# Patient Record
Sex: Female | Born: 1960 | Race: White | Hispanic: No | Smoking: Current every day smoker
Health system: Southern US, Community
[De-identification: ages and names within clinical notes are randomized; demographics above are authoritative.]

## PROBLEM LIST (undated history)

## (undated) DIAGNOSIS — M1990 Unspecified osteoarthritis, unspecified site: Secondary | ICD-10-CM

## (undated) DIAGNOSIS — N390 Urinary tract infection, site not specified: Secondary | ICD-10-CM

## (undated) DIAGNOSIS — F329 Major depressive disorder, single episode, unspecified: Secondary | ICD-10-CM

## (undated) DIAGNOSIS — A09 Infectious gastroenteritis and colitis, unspecified: Secondary | ICD-10-CM

## (undated) DIAGNOSIS — F419 Anxiety disorder, unspecified: Secondary | ICD-10-CM

## (undated) DIAGNOSIS — I10 Essential (primary) hypertension: Secondary | ICD-10-CM

## (undated) DIAGNOSIS — F32A Depression, unspecified: Secondary | ICD-10-CM

## (undated) DIAGNOSIS — E7800 Pure hypercholesterolemia, unspecified: Secondary | ICD-10-CM

## (undated) DIAGNOSIS — C4490 Unspecified malignant neoplasm of skin, unspecified: Secondary | ICD-10-CM

## (undated) DIAGNOSIS — K8020 Calculus of gallbladder without cholecystitis without obstruction: Secondary | ICD-10-CM

## (undated) DIAGNOSIS — K219 Gastro-esophageal reflux disease without esophagitis: Secondary | ICD-10-CM

## (undated) DIAGNOSIS — J189 Pneumonia, unspecified organism: Secondary | ICD-10-CM

## (undated) HISTORY — DX: Unspecified malignant neoplasm of skin, unspecified: C44.90

## (undated) HISTORY — DX: Gastro-esophageal reflux disease without esophagitis: K21.9

## (undated) HISTORY — PX: OTHER SURGICAL HISTORY: SHX169

## (undated) HISTORY — DX: Calculus of gallbladder without cholecystitis without obstruction: K80.20

## (undated) HISTORY — PX: ABDOMINAL HYSTERECTOMY: SHX81

## (undated) HISTORY — DX: Infectious gastroenteritis and colitis, unspecified: A09

## (undated) HISTORY — DX: Pure hypercholesterolemia, unspecified: E78.00

## (undated) HISTORY — DX: Depression, unspecified: F32.A

## (undated) HISTORY — PX: COLONOSCOPY: SHX174

## (undated) HISTORY — DX: Major depressive disorder, single episode, unspecified: F32.9

## (undated) HISTORY — DX: Anxiety disorder, unspecified: F41.9

## (undated) HISTORY — DX: Urinary tract infection, site not specified: N39.0

## (undated) HISTORY — DX: Pneumonia, unspecified organism: J18.9

## (undated) HISTORY — DX: Unspecified osteoarthritis, unspecified site: M19.90

---

## 2007-07-06 ENCOUNTER — Ambulatory Visit: Payer: Self-pay | Admitting: Gastroenterology

## 2007-07-20 ENCOUNTER — Ambulatory Visit: Payer: Self-pay | Admitting: Gastroenterology

## 2007-07-20 ENCOUNTER — Encounter: Payer: Self-pay | Admitting: Gastroenterology

## 2007-10-31 DIAGNOSIS — E7800 Pure hypercholesterolemia, unspecified: Secondary | ICD-10-CM

## 2007-10-31 DIAGNOSIS — I499 Cardiac arrhythmia, unspecified: Secondary | ICD-10-CM | POA: Insufficient documentation

## 2007-10-31 DIAGNOSIS — C539 Malignant neoplasm of cervix uteri, unspecified: Secondary | ICD-10-CM | POA: Insufficient documentation

## 2007-10-31 DIAGNOSIS — M129 Arthropathy, unspecified: Secondary | ICD-10-CM | POA: Insufficient documentation

## 2007-10-31 DIAGNOSIS — K5730 Diverticulosis of large intestine without perforation or abscess without bleeding: Secondary | ICD-10-CM | POA: Insufficient documentation

## 2007-10-31 DIAGNOSIS — K648 Other hemorrhoids: Secondary | ICD-10-CM | POA: Insufficient documentation

## 2007-10-31 DIAGNOSIS — F341 Dysthymic disorder: Secondary | ICD-10-CM | POA: Insufficient documentation

## 2007-10-31 DIAGNOSIS — C4490 Unspecified malignant neoplasm of skin, unspecified: Secondary | ICD-10-CM

## 2007-10-31 DIAGNOSIS — T782XXA Anaphylactic shock, unspecified, initial encounter: Secondary | ICD-10-CM | POA: Insufficient documentation

## 2007-10-31 DIAGNOSIS — K209 Esophagitis, unspecified without bleeding: Secondary | ICD-10-CM | POA: Insufficient documentation

## 2007-10-31 DIAGNOSIS — K219 Gastro-esophageal reflux disease without esophagitis: Secondary | ICD-10-CM

## 2007-10-31 DIAGNOSIS — K449 Diaphragmatic hernia without obstruction or gangrene: Secondary | ICD-10-CM | POA: Insufficient documentation

## 2007-10-31 DIAGNOSIS — D126 Benign neoplasm of colon, unspecified: Secondary | ICD-10-CM

## 2007-10-31 DIAGNOSIS — M779 Enthesopathy, unspecified: Secondary | ICD-10-CM | POA: Insufficient documentation

## 2007-11-01 ENCOUNTER — Encounter: Payer: Self-pay | Admitting: Gastroenterology

## 2010-12-01 ENCOUNTER — Other Ambulatory Visit: Payer: Self-pay | Admitting: Dermatology

## 2011-01-20 NOTE — Assessment & Plan Note (Signed)
[facility]                         GASTROENTEROLOGY OFFICE NOTE   GICELA, SCHWARTING                        MRN:          [id number]  DATE:07/06/2007                            DOB:          1960-10-31    REFERRING PHYSICIAN:  Burgess Estelle, M.D.   REASON FOR CONSULTATION:  Black stools, abdominal pain and reflux  symptoms.   HISTORY OF PRESENT ILLNESS:  [name] is a 50 year old white female who  relates a long history of reflux problems.  She has occasionally taken  medications to control her symptoms.  Since June 2008 she has been  having recurrent episodes of anaphylaxis, associated with lip swelling  and generalized rashes.  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  She was placed on  ranitidine 150 mg b.i.d., primarily for her anaphylactic reactions and  her reflux symptoms and epigastric pain substantially improved.  Ranitidine was discontinued several weeks ago, and she noted worsening  problems with epigastric pain and 2 episodes of dark, black tarry  stools.  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  Her stools returned to a  normal brown color over the course of several days.  She has episodic  diarrhea, generally on days when she is not at work; and on days when  she is at work she tends not to move her bowels.  She relates lower  abdominal discomfort when she has diarrhea.  She notes no dysphagia,  odynophagia, change in stool caliber or bright red rectal bleeding.  She  has a paternal uncle who developed colon cancer in his 17s; no other  family members with colon cancer, colon polyps or inflammatory bowel  disease.  A CBC dated June 24, 2007 revealed a hemoglobin 14.3, with  a hematocrit of 42.6.  Her white blood cell count was minimally elevated  at 13.1, platelet count 328,000.  She has been using Advil  intermittently for tendinitis, but notes that Advil tends to cause  epigastric discomfort and diarrhea.  She has discontinued this and is  now only using Tylenol p.r.n.   PAST MEDICAL HISTORY:  1. Arthrtis.  2. Tendinitis.  3. Depression.  4. Anxiety.  5. Gastroesophageal reflux disease.  6. History of cervical cancer.  7. History of basal skin cell cancer.  8. Anaphylaxis - cause uncertain.  9. Status post hysterectomy.   CURRENT MEDICATIONS:  Listed on the chart, updated and reviewed.   MEDICATION ALLERGIES:  SULFA DRUGS (leading to rash).   SOCIAL HISTORY:  She is divorced with no children.  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  She  smokes approximately 1/2 pack of cigarettes a day, and denies other  tobacco product usage.  She uses alcohol occasionally.   REVIEW OF SYSTEMS:  Has multiple areas positive, as per the handwritten  form.   PHYSICAL EXAMINATION:  Overweight white female, in no acute  distress.  Height 4 feet 11-3/4 inches.  Weight 145.4 pounds.  Blood pressure  102/70, pulse 88 and regular.  HEENT:  Anicteric sclerae.  Oropharynx clear.  NECK:  Without thyromegaly or adenopathy appreciated.  CHEST:  Clear to auscultation bilaterally.  CARDIAC:  Regular rate and rhythm without murmur appreciated.  ABDOMEN:  Soft with mild to moderate epigastric tenderness with deep  palpation.  No rebound or guarding.  No palpable organomegaly, masses or  hernias.  Normal active bowel sounds.  RECTAL:  Digital rectal examination reveals no lesions and Hemocult-  negative brown stool in the vault.  EXTREMITIES:  Without clubbing, cyanosis or edema.  NEUROLOGIC:  Alert and oriented x3.  Grossly nonfocal.   ASSESSMENT AND PLAN:  Recent episode of melena.  Chronic reflux  symptoms.  Epigastric tenderness on physical examination.  Rule out  ulcer disease, gastritis,  duodenitis, esophagitis, Barrett's esophagus  and colorectal neoplasms.  Begin Prilosec over-the-counter 20 mg p.o.  q.a.m. or resume ranitidine 150 mg p.o. b.i.d.  Avoid all aspirin and  NSAID products.  Begin standard anti-reflux measures.  The risks,  benefits and alternatives to colonoscopy with possible biopsy and  possible polypectomy and upper endoscopy with possible biopsy were  discussed with the patient.  She consents to proceed and will be  scheduled electively.     Venita Lick. Russella Dar, MD, [facility]  Electronically Signed    MTS/MedQ  DD: 07/06/2007  DT: 07/06/2007  Job #: [id number]   cc:   Burgess Estelle, M.D.

## 2015-04-28 ENCOUNTER — Encounter (HOSPITAL_COMMUNITY): Payer: Self-pay | Admitting: Emergency Medicine

## 2015-04-28 ENCOUNTER — Emergency Department (HOSPITAL_COMMUNITY): Payer: BLUE CROSS/BLUE SHIELD

## 2015-04-28 ENCOUNTER — Emergency Department (HOSPITAL_COMMUNITY)
Admission: EM | Admit: 2015-04-28 | Discharge: 2015-04-28 | Disposition: A | Discharge status: Home / Self Care | Payer: BLUE CROSS/BLUE SHIELD | Attending: Emergency Medicine | Admitting: Emergency Medicine

## 2015-04-28 DIAGNOSIS — Z7902 Long term (current) use of antithrombotics/antiplatelets: Secondary | ICD-10-CM | POA: Diagnosis not present

## 2015-04-28 DIAGNOSIS — I10 Essential (primary) hypertension: Secondary | ICD-10-CM | POA: Diagnosis not present

## 2015-04-28 DIAGNOSIS — Z79899 Other long term (current) drug therapy: Secondary | ICD-10-CM | POA: Diagnosis not present

## 2015-04-28 DIAGNOSIS — Z791 Long term (current) use of non-steroidal anti-inflammatories (NSAID): Secondary | ICD-10-CM | POA: Diagnosis not present

## 2015-04-28 DIAGNOSIS — Z87440 Personal history of urinary (tract) infections: Secondary | ICD-10-CM | POA: Diagnosis not present

## 2015-04-28 DIAGNOSIS — J039 Acute tonsillitis, unspecified: Secondary | ICD-10-CM | POA: Insufficient documentation

## 2015-04-28 DIAGNOSIS — D72829 Elevated white blood cell count, unspecified: Secondary | ICD-10-CM | POA: Insufficient documentation

## 2015-04-28 DIAGNOSIS — H9209 Otalgia, unspecified ear: Secondary | ICD-10-CM | POA: Insufficient documentation

## 2015-04-28 DIAGNOSIS — J029 Acute pharyngitis, unspecified: Secondary | ICD-10-CM | POA: Diagnosis present

## 2015-04-28 HISTORY — DX: Essential (primary) hypertension: I10

## 2015-04-28 LAB — CBC WITH DIFFERENTIAL/PLATELET
BASOS PCT: 0 % (ref 0–1)
Basophils Absolute: 0 10*3/uL (ref 0.0–0.1)
Eosinophils Absolute: 0.1 10*3/uL (ref 0.0–0.7)
Eosinophils Relative: 0 % (ref 0–5)
HEMATOCRIT: 39.3 % (ref 36.0–46.0)
HEMOGLOBIN: 12.7 g/dL (ref 12.0–15.0)
Lymphocytes Relative: 15 % (ref 12–46)
Lymphs Abs: 3.5 10*3/uL (ref 0.7–4.0)
MCH: 28.8 pg (ref 26.0–34.0)
MCHC: 32.3 g/dL (ref 30.0–36.0)
MCV: 89.1 fL (ref 78.0–100.0)
MONOS PCT: 8 % (ref 3–12)
Monocytes Absolute: 1.8 10*3/uL — ABNORMAL HIGH (ref 0.1–1.0)
NEUTROS ABS: 17.9 10*3/uL — AB (ref 1.7–7.7)
NEUTROS PCT: 77 % (ref 43–77)
Platelets: 300 10*3/uL (ref 150–400)
RBC: 4.41 MIL/uL (ref 3.87–5.11)
RDW: 12.9 % (ref 11.5–15.5)
WBC: 23.3 10*3/uL — ABNORMAL HIGH (ref 4.0–10.5)

## 2015-04-28 LAB — URINE MICROSCOPIC-ADD ON

## 2015-04-28 LAB — LIPASE, BLOOD: Lipase: 20 U/L — ABNORMAL LOW (ref 22–51)

## 2015-04-28 LAB — URINALYSIS, ROUTINE W REFLEX MICROSCOPIC
BILIRUBIN URINE: NEGATIVE
GLUCOSE, UA: NEGATIVE mg/dL
Ketones, ur: NEGATIVE mg/dL
Nitrite: NEGATIVE
PH: 6 (ref 5.0–8.0)
Protein, ur: 30 mg/dL — AB
SPECIFIC GRAVITY, URINE: 1.022 (ref 1.005–1.030)
Urobilinogen, UA: 0.2 mg/dL (ref 0.0–1.0)

## 2015-04-28 LAB — I-STAT CG4 LACTIC ACID, ED: Lactic Acid, Venous: 1.23 mmol/L (ref 0.5–2.0)

## 2015-04-28 LAB — COMPREHENSIVE METABOLIC PANEL
ALBUMIN: 3.5 g/dL (ref 3.5–5.0)
ALK PHOS: 122 U/L (ref 38–126)
ALT: 11 U/L — AB (ref 14–54)
ANION GAP: 11 (ref 5–15)
AST: 17 U/L (ref 15–41)
BILIRUBIN TOTAL: 0.3 mg/dL (ref 0.3–1.2)
BUN: 9 mg/dL (ref 6–20)
CALCIUM: 9.6 mg/dL (ref 8.9–10.3)
CO2: 23 mmol/L (ref 22–32)
CREATININE: 0.64 mg/dL (ref 0.44–1.00)
Chloride: 102 mmol/L (ref 101–111)
GFR calc Af Amer: >60 mL/min (ref >60)
GFR calc non Af Amer: >60 mL/min (ref >60)
GLUCOSE: 137 mg/dL — AB (ref 65–99)
Potassium: 3.7 mmol/L (ref 3.5–5.1)
Sodium: 136 mmol/L (ref 135–145)
TOTAL PROTEIN: 7.7 g/dL (ref 6.5–8.1)

## 2015-04-28 MED ORDER — SODIUM CHLORIDE 0.9 % IV BOLUS (SEPSIS)
500.0000 mL | Freq: Once | INTRAVENOUS | Status: AC
  Administered 2015-04-28: 500 mL via INTRAVENOUS

## 2015-04-28 MED ORDER — HYDROCODONE-ACETAMINOPHEN 5-325 MG PO TABS
2.0000 | ORAL_TABLET | ORAL | Status: DC | PRN
Start: 2015-04-28 — End: 2017-09-09

## 2015-04-28 MED ORDER — IOHEXOL 300 MG/ML  SOLN
100.0000 mL | Freq: Once | INTRAMUSCULAR | Status: AC | PRN
  Administered 2015-04-28: 100 mL via INTRAVENOUS

## 2015-04-28 NOTE — ED Provider Notes (Signed)
CSN: [id number]     Arrival date & time 04/28/15  0147 History   First MD Initiated Contact with Patient 04/28/15 0202     Chief Complaint  Patient presents with  . Fever  . Sore Throat     (Consider location/radiation/quality/duration/timing/severity/associated sxs/prior Treatment) HPI Comments: Patient states that she has not felt well for the past week.  She's been to [facility].  She's had multiple tests including strep, mono, blood cultures all with normal results.  She was diagnosed with a urinary tract infection and was started on Keflex.  This was discontinued after several days when she was not feeling better and she was started on Omnicef, which is still taking.  Initially when she was seen she had an elevated white count of 19.2 yesterday, when she was seen again.  She had an elevated white count of 25.  Without a definitive cause.  She denies any nausea, vomiting, diarrhea, constipation, cough, recent travel, tick bites, although she does report she was bitten by a tick 8 years ago and developed alpha gall antibodies .  Denies any consumption of mammal meat since that time  Patient is a 54 y.o. female presenting with fever and pharyngitis. The history is provided by the patient.  Fever Temp source:  Unable to specify Severity:  Mild Onset quality:  Unable to specify Timing:  Unable to specify Chronicity:  New Ineffective treatments:  None tried Associated symptoms: congestion and ear pain   Associated symptoms: no chills, no cough, no headaches and no rhinorrhea   Sore Throat Associated symptoms include congestion and a fever. Pertinent negatives include no chills, coughing or headaches.    Past Medical History  Diagnosis Date  . Hypertension    Past Surgical History  Procedure Laterality Date  . Abdominal hysterectomy     No family history on file. Social History  Substance Use Topics  . Smoking status: Never Smoker   . Smokeless  tobacco: None  . Alcohol Use: No   OB History    No data available     Review of Systems  Constitutional: Positive for fever. Negative for chills.  HENT: Positive for congestion, ear discharge and ear pain. Negative for rhinorrhea.   Respiratory: Negative for cough.   Neurological: Negative for dizziness and headaches.  All other systems reviewed and are negative.     Allergies  Alpha-gal and Sulfa antibiotics  Home Medications   Prior to Admission medications   Medication Sig Start Date End Date Taking? Authorizing Provider  cilostazol (PLETAL) 100 MG tablet Take 100 mg by mouth daily.   Yes Historical Provider, MD  clopidogrel (PLAVIX) 75 MG tablet Take 75 mg by mouth daily.   Yes Historical Provider, MD  Cyanocobalamin (VITAMIN B 12 PO) Take 1 tablet by mouth daily.   Yes Historical Provider, MD  EPINEPHrine 0.3 mg/0.3 mL IJ SOAJ injection Inject 0.3 mg into the muscle once.   Yes Historical Provider, MD  KRILL OIL OMEGA-3 PO Take 1 capsule by mouth daily.   Yes Historical Provider, MD  loratadine (CLARITIN) 10 MG tablet Take 10 mg by mouth daily.   Yes Historical Provider, MD  meloxicam (MOBIC) 15 MG tablet Take 15 mg by mouth daily.   Yes Historical Provider, MD  nebivolol (BYSTOLIC) 10 MG tablet Take 10 mg by mouth daily.   Yes Historical Provider, MD  omeprazole (PRILOSEC) 20 MG capsule Take 20 mg by mouth daily.   Yes Historical Provider, MD  venlafaxine XR (EFFEXOR-XR) 150 MG 24 hr capsule Take 150 mg by mouth daily with breakfast.   Yes Historical Provider, MD  Vitamin D, Ergocalciferol, (DRISDOL) 50000 UNITS CAPS capsule Take 50,000 Units by mouth 2 (two) times a week.   Yes Historical Provider, MD  zolpidem (AMBIEN) 5 MG tablet Take 5 mg by mouth at bedtime.   Yes Historical Provider, MD   BP 106/75 mmHg  Pulse 98  Temp(Src) 98.2 F (36.8 C) (Oral)  Resp 13  SpO2 98% Physical Exam  Constitutional: She appears well-developed and well-nourished.  HENT:  Head:  Normocephalic.  Right Ear: External ear normal. Tympanic membrane is not bulging.  Left Ear: Tympanic membrane is not bulging.  Mouth/Throat: Uvula is midline. Oropharyngeal exudate and posterior oropharyngeal edema present.  Eyes: Pupils are equal, round, and reactive to light.  Neck: Normal range of motion.  Cardiovascular: Normal rate and regular rhythm.   Pulmonary/Chest: Effort normal and breath sounds normal.  Abdominal: Soft. Bowel sounds are normal.  Musculoskeletal: Normal range of motion. She exhibits no edema or tenderness.  Lymphadenopathy:    She has cervical adenopathy.  Neurological: She is alert.  Skin: Skin is warm. No erythema. There is pallor.  Vitals reviewed.   ED Course  Procedures (including critical care time) Labs Review Labs Reviewed  CBC WITH DIFFERENTIAL/PLATELET - Abnormal; Notable for the following:    WBC 23.3 (*)    Neutro Abs 17.9 (*)    Monocytes Absolute 1.8 (*)    All other components within normal limits  COMPREHENSIVE METABOLIC PANEL - Abnormal; Notable for the following:    Glucose, Bld 137 (*)    ALT 11 (*)    All other components within normal limits  LIPASE, BLOOD - Abnormal; Notable for the following:    Lipase 20 (*)    All other components within normal limits  URINALYSIS, ROUTINE W REFLEX MICROSCOPIC (NOT AT [facility]) - Abnormal; Notable for the following:    APPearance CLOUDY (*)    Hgb urine dipstick LARGE (*)    Protein, ur 30 (*)    Leukocytes, UA TRACE (*)    All other components within normal limits  URINE MICROSCOPIC-ADD ON - Abnormal; Notable for the following:    Squamous Epithelial / LPF FEW (*)    All other components within normal limits  I-STAT CG4 LACTIC ACID, ED  I-STAT CG4 LACTIC ACID, ED    Imaging Review Ct Soft Tissue Neck W Contrast  04/28/2015   CLINICAL DATA:  Fever and chills was sore throat for 1 week. Leukocytosis.  EXAM: CT NECK WITH CONTRAST  TECHNIQUE: Multidetector CT imaging of the neck was  performed using the standard protocol following the bolus administration of intravenous contrast.  CONTRAST:  176mL OMNIPAQUE IOHEXOL 300 MG/ML  SOLN  COMPARISON:  None.  FINDINGS: Pharynx and larynx: There is avid enhancement of the palatine and lingual tonsils with crypt low-density consistent with purulent material. No abscess or airway effacement. The epiglottis and larynx are unremarkable. No floor of mouth swelling. Enlarged bilateral cervical chain lymph nodes, greatest in the jugulodigastric stations, presumably reactive. There is no cavitation within the nodes. No retropharyngeal edema  Salivary glands: Negative  Thyroid: Sub cm nodule on the left is considered incidental based on size.  Vascular: No venous thrombosis. The cervical carotids are tortuous, especially the distal left ICA with widening which could reflect remote injury. No beading typical of fibromuscular dysplasia.  Limited intracranial: Negative  Visualized orbits: Negative  Mastoids and  visualized paranasal sinuses: Clear  Skeleton: Unremarkable  Upper chest: No pneumonia.  IMPRESSION: Tonsillitis with cervical adenopathy.  No abscess.   Electronically Signed   By: Monte Fantasia M.D.   On: 04/28/2015 04:09   I have personally reviewed and evaluated these images and lab results as part of my medical decision-making.   EKG Interpretation None     Patient does have an elevated white count of 23.3 but this is improved since last nights.  White count at [place].  Her CT scan shows that she has tonsil colitis, but no peritonsillar abscess.  Spoke with patient.  Recommend she continue taking her into biotic.  Her biggest complaint is the pain in her I will give her some pain medicine for the next several days or follow-up with her primary care physician [facility]DM   Final diagnoses:  None         Junius Creamer, NP 04/28/15 [place], MD 04/28/15 [phone]

## 2015-04-28 NOTE — Discharge Instructions (Signed)
A CT scan shows that you do not have an abscess in your throat.  You do have tonsillitis.  Please continue to take the Antivert by out of your prescribed several days ago.  U been given a referral to the ENT doctor if needed.  Please follow-up with your primary care physician

## 2015-04-28 NOTE — ED Notes (Signed)
Patient transported to CT 

## 2015-04-28 NOTE — ED Notes (Addendum)
Pt from home c/o fever, chills, sore throat x 1 week. She reports being treated with Omnicef for abcess of tonsils. She reports she is not feeling better. She reports she has been tested for strep,mono, and blood cultures were negative.

## 2015-04-28 NOTE — ED Notes (Signed)
Nurse starting IV 

## 2017-07-21 ENCOUNTER — Ambulatory Visit: Payer: BLUE CROSS/BLUE SHIELD

## 2017-07-21 ENCOUNTER — Encounter: Payer: Self-pay | Admitting: Podiatry

## 2017-07-21 ENCOUNTER — Ambulatory Visit: Payer: BLUE CROSS/BLUE SHIELD | Admitting: Podiatry

## 2017-07-21 DIAGNOSIS — M79671 Pain in right foot: Secondary | ICD-10-CM

## 2017-07-21 DIAGNOSIS — M6740 Ganglion, unspecified site: Secondary | ICD-10-CM | POA: Diagnosis not present

## 2017-07-21 NOTE — Progress Notes (Signed)
   Subjective:    Patient ID: Autumn Hull, female    DOB: 1961-07-12, 56 y.o.   MRN: [id number]  HPI    Review of Systems  All other systems reviewed and are negative.      Objective:   Physical Exam        Assessment & Plan:

## 2017-07-22 NOTE — Progress Notes (Signed)
Subjective:    Patient ID: Autumn Hull, female   DOB: 56 y.o.   MRN: [id number]   HPI patient presents with a cyst of the big toe joint of the right foot and states it's been inflamed and sore at times and that for the most part it doesn't hurt but she was concerned about its appearance. Patient does not smoke and likes to be active    Review of Systems  All other systems reviewed and are negative.       Objective:  Physical Exam  Constitutional: She appears well-developed and well-nourished.  Cardiovascular: Intact distal pulses.  Pulmonary/Chest: Effort normal.  Musculoskeletal: Normal range of motion.  Neurological: She is alert.  Skin: Skin is warm.  Nursing note and vitals reviewed.  neurovascular status intact muscle strength adequate range of motion within normal limits with patient found to have a cyst formation at the distal interphalangeal joint hallux right medial side that measures about 7 x 7 mm. 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 Gave instructions on soaks and reappoint  X-rays were negative for signs of bony calcification or narrowing of the joint surface

## 2017-08-11 ENCOUNTER — Encounter: Payer: Self-pay | Admitting: Gastroenterology

## 2017-08-30 ENCOUNTER — Telehealth: Payer: Self-pay | Admitting: General Practice

## 2017-08-30 ENCOUNTER — Telehealth: Payer: Self-pay | Admitting: Gastroenterology

## 2017-08-30 NOTE — Telephone Encounter (Signed)
Patient is scheduled to see [facility] on 09/09/17

## 2017-08-30 NOTE — Telephone Encounter (Signed)
Patient was admitted to [facility] for colitis.  She is scheduled for follow up on 09/09/17 3:15 with Ellouise Newer, PA She is asked to bring a copy of the records with her to the appt.  I explained that we do not have access to their records and will need them the day of the appt for [facility]. She verbalized understanding and will bring them with her

## 2017-08-30 NOTE — Telephone Encounter (Signed)
Pt called today to say that she had been referred to [place] and the referral had been denied because she is established with [facility]. [facility] had mailed her a letter on 08/11/2017 that she was due her colonoscopy. Patient said she hasn't been there in 10 years and she needed hospital follow up care for her colitis and [facility] couldn't see her until mid/late January. I asked where was she admitted and she said [facility]. I told her that we would not have anything until mid/late February and that's if the referral was accepted as a new patient. Patient is wanting to be seen this week and said it was an emergency. I told her we are closed tomorrow for Christmas and wouldn't be open Wednesday either. I told her if it was that bad to go to the ER and they have GI coverage. Patient is insisted on coming here to be seen. I put the original referral in [facility] office. Please advise. [phone]

## 2017-09-09 ENCOUNTER — Other Ambulatory Visit (INDEPENDENT_AMBULATORY_CARE_PROVIDER_SITE_OTHER): Payer: 59

## 2017-09-09 ENCOUNTER — Encounter: Payer: Self-pay | Admitting: Physician Assistant

## 2017-09-09 ENCOUNTER — Ambulatory Visit: Payer: 59 | Admitting: Physician Assistant

## 2017-09-09 VITALS — BP 110/68 | HR 72 | Ht 59.0 in | Wt 165.0 lb

## 2017-09-09 DIAGNOSIS — R112 Nausea with vomiting, unspecified: Secondary | ICD-10-CM

## 2017-09-09 DIAGNOSIS — R197 Diarrhea, unspecified: Secondary | ICD-10-CM | POA: Diagnosis not present

## 2017-09-09 DIAGNOSIS — R935 Abnormal findings on diagnostic imaging of other abdominal regions, including retroperitoneum: Secondary | ICD-10-CM

## 2017-09-09 DIAGNOSIS — R1084 Generalized abdominal pain: Secondary | ICD-10-CM

## 2017-09-09 LAB — CBC WITH DIFFERENTIAL/PLATELET
BASOS PCT: 0.4 % (ref 0.0–3.0)
Basophils Absolute: 0 10*3/uL (ref 0.0–0.1)
EOS PCT: 1.7 % (ref 0.0–5.0)
Eosinophils Absolute: 0.2 10*3/uL (ref 0.0–0.7)
HEMATOCRIT: 41.5 % (ref 36.0–46.0)
HEMOGLOBIN: 13.5 g/dL (ref 12.0–15.0)
LYMPHS PCT: 25.3 % (ref 12.0–46.0)
Lymphs Abs: 2.9 10*3/uL (ref 0.7–4.0)
MCHC: 32.6 g/dL (ref 30.0–36.0)
MCV: 91.3 fl (ref 78.0–100.0)
Monocytes Absolute: 1 10*3/uL (ref 0.1–1.0)
Monocytes Relative: 8.6 % (ref 3.0–12.0)
Neutro Abs: 7.3 10*3/uL (ref 1.4–7.7)
Neutrophils Relative %: 64 % (ref 43.0–77.0)
Platelets: 325 10*3/uL (ref 150.0–400.0)
RBC: 4.55 Mil/uL (ref 3.87–5.11)
RDW: 13.6 % (ref 11.5–15.5)
WBC: 11.4 10*3/uL — AB (ref 4.0–10.5)

## 2017-09-09 LAB — COMPREHENSIVE METABOLIC PANEL
ALT: 18 U/L (ref 0–35)
AST: 18 U/L (ref 0–37)
Albumin: 4 g/dL (ref 3.5–5.2)
Alkaline Phosphatase: 130 U/L — ABNORMAL HIGH (ref 39–117)
BUN: 10 mg/dL (ref 6–23)
CHLORIDE: 102 meq/L (ref 96–112)
CO2: 32 mEq/L (ref 19–32)
Calcium: 9.4 mg/dL (ref 8.4–10.5)
Creatinine, Ser: 0.61 mg/dL (ref 0.40–1.20)
GFR: 107.06 mL/min (ref >60.00)
Glucose, Bld: 92 mg/dL (ref 70–99)
POTASSIUM: 4.3 meq/L (ref 3.5–5.1)
SODIUM: 140 meq/L (ref 135–145)
Total Bilirubin: 0.4 mg/dL (ref 0.2–1.2)
Total Protein: 6.9 g/dL (ref 6.0–8.3)

## 2017-09-09 MED ORDER — HYOSCYAMINE SULFATE 0.125 MG SL SUBL: 0.1250 mg | SUBLINGUAL_TABLET | SUBLINGUAL | 1 refills | Status: DC | PRN

## 2017-09-09 NOTE — Progress Notes (Addendum)
Chief Complaint: "colitis"  HPI:    Autumn Hull is a 57 year old female with a past medical history as listed below, including anticoagulatiojn with Pletal and Plavix for PVD, who was referred to me by Earney Mallet, MD for a complaint of "colitis".      Patient has followed in our clinic previously in 2008 with Dr. Fuller Plan.  Patient's last colonoscopy was completed 07/20/2007 with a finding of colon polyps, diverticulosis and internal hemorrhoids.  It was recommended she have a repeat colonoscopy in 10 years.  Patient had an EGD 07/20/2007 due to epigastric abdominal pain and reflux of melena.  She had a finding of reflux esophagitis and a hiatal hernia.    Today, the patient brings with her records from a recent hospital visit to [facility].  According to an HPI patient was admitted 08/26/17 with ongoing diarrhea, nausea, vomiting and lightheadedness.  She described starting with watery diarrhea about 4-5 days prior to being seen which had been going at least 15 times a day for some time.  She was also having nausea and vomiting with her last episode that morning.  Labs showed an elevated white count of 13.8.  She had a CT of the abdomen and pelvis which showed a fluid-filled ascending colon and hepatic flexure which appeared mildly inflamed.  It was suspected she had an acute infectious colitis and there was suspected possible hyperenhancement of the small bowel indicating an acute enterocolitis.  There was no bowel obstruction or acute appendicitis suspected as well as no free fluid.  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  She had borderline-mild cardiomegaly and hepatic steatosis.  She was admitted and fluid resuscitated and treated with Zofran as well as empiric antibiotics including IV ciprofloxacin and IV Flagyl for possible infectious colitis for 3 days.  Patient was discharged on 08/26/17 and at that time her white count was 13.8 and she did continue with some mild abdominal  discomfort.    Today, the patient tells me that since discharge she has gradually started to have more formed stool.  She denies any liquid stools at this point and is only having 1 or 2 bowel movements a day.  She does continue with abdominal pain and describes this as a "cramping", this is somewhat worse after eating anything even though "I am not eating much at all".  She has been trying to abide by a [employer].  Patient tells me she has had some continued episodes of nausea and in fact vomited her coffee this morning but tells me this was "because I drank it on an empty stomach".  She denies any continued feeling of dizziness.  She does admit to continually feeling weak.  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  She denies any continued fever or chills.  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    Patient's last colonoscopy was as above, she is due for repeat.    Patient denies fever, chills, weight loss, anorexia, hematochezia, melena or symptoms that awaken her at night.  Past Medical History:  Diagnosis Date  . Hypertension     Past Surgical History:  Procedure Laterality Date  . ABDOMINAL HYSTERECTOMY      Current Outpatient Medications  Medication Sig Dispense Refill  . cilostazol (PLETAL) 100 MG tablet  Take 100 mg by mouth daily.    . clopidogrel (PLAVIX) 75 MG tablet Take 75 mg by mouth daily.    Marland Kitchen EPINEPHrine 0.3 mg/0.3 mL IJ SOAJ injection Inject 0.3 mg into the muscle once.    Marland Kitchen KRILL OIL OMEGA-3 PO Take 1 capsule by mouth daily.    Marland Kitchen loratadine (CLARITIN) 10 MG tablet Take 10 mg by mouth daily.    . meloxicam (MOBIC) 15 MG tablet Take 15 mg by mouth daily.    . mirtazapine (REMERON) 7.5 MG tablet Take 7.5 mg by mouth at bedtime.    . nebivolol (BYSTOLIC) 10 MG tablet Take 10 mg by mouth daily.    Marland Kitchen  omeprazole (PRILOSEC) 20 MG capsule Take 20 mg by mouth daily.    Marland Kitchen venlafaxine XR (EFFEXOR-XR) 150 MG 24 hr capsule Take 150 mg by mouth daily with breakfast.    . Vitamin D, Ergocalciferol, (DRISDOL) 50000 UNITS CAPS capsule Take 50,000 Units by mouth 2 (two) times a week.    . zolpidem (AMBIEN) 5 MG tablet Take 5 mg by mouth at bedtime.     No current facility-administered medications for this visit.     Allergies as of 09/09/2017 - Review Complete 09/09/2017  Allergen Reaction Noted  . 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 Sulfa antibiotics  04/28/2015    No family history on file.  Social History   Socioeconomic History  . Marital status: Divorced    Spouse name: Not on file  . Number of children: Not on file  . Years of education: Not on file  . Highest education level: Not on file  Social Needs  . Financial resource strain: Not on file  . Food insecurity - worry: Not on file  . Food insecurity - inability: Not on file  . Transportation needs - medical: Not on file  . Transportation needs - non-medical: Not on file  Occupational History  . Not on file  Tobacco Use  . Smoking status: Never Smoker  Substance and Sexual Activity  . Alcohol use: No  . Drug use: Not on file  . Sexual activity: Not on file  Other Topics Concern  . Not on file  Social History Narrative  . Not on file    Review of Systems:    Constitutional: No weight loss, fever or chills Skin: No rash  Cardiovascular: No chest pain Respiratory: No SOB  Gastrointestinal: See HPI and otherwise negative Genitourinary: No dysuria Neurological: No headache, dizziness or syncope Musculoskeletal: No new muscle or joint pain Hematologic: No bleeding  Psychiatric: No history of depression or anxiety   Physical Exam:  Vital signs: Ht 4\' 11"  (1.499 m) Comment: height measured without shoes.  Wt 165 lb (74.8 kg)   BMI 33.33 kg/m   Constitutional:   Pleasant overweight Caucasian female appears to be in  NAD, Well developed, Well nourished, alert and cooperative Head:  Normocephalic and atraumatic. Eyes:   PEERL, EOMI. No icterus. Conjunctiva pink. Ears:  Normal auditory acuity. Neck:  Supple Throat: Oral cavity and pharynx without inflammation, swelling or lesion.  Respiratory: Respirations even and unlabored. Lungs clear to auscultation bilaterally.   No wheezes, crackles, or rhonchi.  Cardiovascular: Normal S1, S2. No MRG. Regular rate and rhythm. No peripheral edema, cyanosis or pallor.  Gastrointestinal:  Soft, nondistended, moderate generalized ttp, No rebound or guarding. Normal bowel sounds. No appreciable masses or hepatomegaly. Rectal:  Not performed.  Msk:  Symmetrical without gross deformities. Without edema, no deformity or joint abnormality.  Neurologic:  Alert and  oriented x4;  grossly normal neurologically.  Skin:   Dry and intact without significant lesions or rashes. Psychiatric: Demonstrates good judgement and reason without abnormal affect or behaviors.  See HPI for recent labs and imaging.  Assessment: 1.  Abnormal CT of the abdomen and pelvis: As in HPI showing colitis, suspected infectious etiology, patient had 3 days of Cipro/ Flagyl IV in the hospital, since discharge has gotten some better but does continue with abdominal pain, occasional nausea and vomiting; concern for ongoing colitis 2.  Abdominal pain: Patient continues with a cramping pain, consider continued colitis versus other 3.  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  Diarrhea: Better after hospital stay and 3 days of Cipro/ Flagyl, suspected to be infectious, though no stool studies were done, CT with colitis as above and in HPI  Plan: 1.  Ordered a repeat CT of the abdomen and pelvis.  Patient requested this be done in  [place]. 2.  Patient will need a colonoscopy in the future, pending results of above.  If it appears patient is still inflamed, could consider 7 days of Cipro/ Flagyl and then waiting 4-6 weeks prior to colonoscopy.  If CT is normal can go ahead and schedule at next available. 3.  Discussed with the patient that she will need to hold her Pletal for 7 days prior to time of her procedure and her Plavix for 5 days prior to time of her procedure.  We would have to communicate with her cardiologist Dr. Sabra Heck to ensure that holding these is acceptable for her. 4.  Ordered repeat CBC and CMP. 5.  Recommend the patient stay on a low fiber/low residue diet.  Did provide her with a handout for now.  She should continue this for the next 1-2 weeks. 6.  Provided the patient with a prescription for Hyoscyamine sulfate 0.125 mg ODT, every 4-6 hours as needed for abdominal cramping #30 with 1 refill 7.  Patient to follow in clinic per recommendations after CT and labs reviewed.  When she has colonoscopy, this will need to be scheduled with Dr. Fuller Plan in [facility].  Ellouise Newer, PA-C [facility] 09/09/2017, 3:23 PM  Cc: Earney Mallet, MD   09/23/17-1234 Reviewed patient's CT abdomen and pelvis with contrast completed 09/17/17.  This shows no acute findings in the abdomen or pelvis. jll

## 2017-09-09 NOTE — Patient Instructions (Signed)
We have given you a Low fiber/low residue   We have sent the following medications to your pharmacy for you to pick up at your convenience: Hyoscyamine 0.125 mg every 4-6 hours as needed for abd cramping  You have been scheduled for a CT scan of the abdomen and pelvis at [facility] [address] [place], [place] [address].   You are scheduled on 09/15/17 at 10:45 am. You should arrive 15 minutes prior to your appointment time for registration. Please follow the written instructions below on the day of your exam:  WARNING: IF YOU ARE ALLERGIC TO IODINE/X-RAY DYE, PLEASE NOTIFY RADIOLOGY IMMEDIATELY AT [phone]! YOU WILL BE GIVEN A 13 HOUR PREMEDICATION PREP.  1) Do not eat anything after 6:45 am (4 hours prior to your test) 2) You have been given 2 bottles of oral contrast to drink. The solution may taste               better if refrigerated, but do NOT add ice or any other liquid to this solution. Shake             well before drinking.    Drink 1 bottle of contrast @ 8:45 am (2 hours prior to your exam)  Drink 1 bottle of contrast @ 9:45 am (1 hour prior to your exam)  You may take any medications as prescribed with a small amount of water except for the following: Metformin, Glucophage, Glucovance, Avandamet, Riomet, Fortamet, Actoplus Met, Janumet, Glumetza or Metaglip. The above medications must be held the day of the exam AND 48 hours after the exam.  The purpose of you drinking the oral contrast is to aid in the visualization of your intestinal tract. The contrast solution may cause some diarrhea. Before your exam is started, you will be given a small amount of fluid to drink. Depending on your individual set of symptoms, you may also receive an intravenous injection of x-ray contrast/dye. Plan on being at [facility] for 30 minutes or longer, depending on the type of exam you are having performed.  This test typically takes 30-45 minutes to complete.  If you have any  questions regarding your exam or if you need to reschedule, you may call the CT department at [phone] between the hours of 8:00 am and 5:00 pm, Monday-Friday.  ________________________________________________________________________

## 2017-09-09 NOTE — Progress Notes (Signed)
Reviewed and agree with initial management plan.  Johannah Rozas T. Dystany Duffy, MD FACG 

## 2017-09-10 ENCOUNTER — Telehealth: Payer: Self-pay | Admitting: Physician Assistant

## 2017-09-10 NOTE — Telephone Encounter (Signed)
Patient returning call from [place] about lab results from yesterday 1.3.19. Pt requesting a call on work number.

## 2017-09-10 NOTE — Telephone Encounter (Signed)
See result note.  

## 2017-09-16 ENCOUNTER — Telehealth: Payer: Self-pay | Admitting: Physician Assistant

## 2017-09-16 NOTE — Telephone Encounter (Signed)
The pt has been re instructed for her CT scan and all questions answered.  She will call back with any further concerns

## 2017-09-17 ENCOUNTER — Encounter: Payer: Self-pay | Admitting: Gastroenterology

## 2017-09-23 ENCOUNTER — Telehealth: Payer: Self-pay | Admitting: Physician Assistant

## 2017-09-23 NOTE — Telephone Encounter (Signed)
I spoke to the imaging center in [place] and they will fax the report to our office.

## 2017-09-23 NOTE — Telephone Encounter (Signed)
Levin Erp, [place]  Jeoffrey Massed, RN        Reviewed CT- Now normal. Thanks-JLL    The pt has been advised and was scheduled to see Dr Fuller Plan to discuss colon.

## 2017-09-23 NOTE — Telephone Encounter (Signed)
Report was received and put on Jennifer's desk for review.

## 2017-10-26 ENCOUNTER — Ambulatory Visit: Payer: 59 | Admitting: Gastroenterology

## 2017-11-10 ENCOUNTER — Ambulatory Visit: Payer: 59 | Admitting: Gastroenterology

## 2017-11-10 ENCOUNTER — Encounter: Payer: Self-pay | Admitting: Gastroenterology

## 2017-11-10 ENCOUNTER — Telehealth: Payer: Self-pay

## 2017-11-10 VITALS — BP 108/62 | HR 70 | Ht 59.75 in | Wt 164.0 lb

## 2017-11-10 DIAGNOSIS — Z7902 Long term (current) use of antithrombotics/antiplatelets: Secondary | ICD-10-CM

## 2017-11-10 DIAGNOSIS — R933 Abnormal findings on diagnostic imaging of other parts of digestive tract: Secondary | ICD-10-CM | POA: Diagnosis not present

## 2017-11-10 DIAGNOSIS — K219 Gastro-esophageal reflux disease without esophagitis: Secondary | ICD-10-CM | POA: Diagnosis not present

## 2017-11-10 DIAGNOSIS — R197 Diarrhea, unspecified: Secondary | ICD-10-CM

## 2017-11-10 DIAGNOSIS — Z1211 Encounter for screening for malignant neoplasm of colon: Secondary | ICD-10-CM | POA: Diagnosis not present

## 2017-11-10 DIAGNOSIS — Z1212 Encounter for screening for malignant neoplasm of rectum: Secondary | ICD-10-CM

## 2017-11-10 MED ORDER — NA SULFATE-K SULFATE-MG SULF 17.5-3.13-1.6 GM/177ML PO SOLN: 1.0000 | ORAL | 0 refills | Status: DC

## 2017-11-10 NOTE — Patient Instructions (Signed)
You have been scheduled for an endoscopy and colonoscopy. Please follow the written instructions given to you at your visit today. Please pick up your prep supplies at the pharmacy within the next 1-3 days. If you use inhalers (even only as needed), please bring them with you on the day of your procedure. Your physician has requested that you go to www.startemmi.com and enter the access code given to you at your visit today. This web site gives a general overview about your procedure. However, you should still follow specific instructions given to you by our office regarding your preparation for the procedure.   If you are age 45 or older, your body mass index should be between 23-30. Your Body mass index is 32.3 kg/m. If this is out of the aforementioned range listed, please consider follow up with your Primary Care Provider.  If you are age 72 or younger, your body mass index should be between 19-25. Your Body mass index is 32.3 kg/m. If this is out of the aformentioned range listed, please consider follow up with your Primary Care Provider.

## 2017-11-10 NOTE — Telephone Encounter (Signed)
Faxed letter to get approval to [facility], Dr. Donnetta Simpers office [phone]. The phone number is [phone].

## 2017-11-10 NOTE — Progress Notes (Signed)
    History of Present Illness: This is a 57 year old female with a resolved infectious colitis. Has had mild chronic intermittent diarrhea for few years that has not changed.  Symptoms primarily brought on by a [place] foods and raw vegetables such as lettuce.  She has had GERD for many years and takes omeprazole daily with good control of symptoms.  She has not previously had EGD. Her last colonoscopy was in 07/2007 showing colon polyps (not precancerous), diverticulosis and internal hemorrhoids.  Abd/pelvic CT 09/17/2017 in [place] was unremarkable.   Current Medications, Allergies, Past Medical History, Past Surgical History, Family History and Social History were reviewed in [employer] record.  Physical Exam: General: Well developed, well nourished, no acute distress Head: Normocephalic and atraumatic Eyes:  sclerae anicteric, EOMI Ears: Normal auditory acuity Mouth: No deformity or lesions Lungs: Clear throughout to auscultation Heart: Regular rate and rhythm; no murmurs, rubs or bruits Abdomen: Soft, non tender and non distended. No masses, hepatosplenomegaly or hernias noted. Normal Bowel sounds Rectal: deferred to colonoscopy Musculoskeletal: Symmetrical with no gross deformities  Pulses:  Normal pulses noted Extremities: No clubbing, cyanosis, edema or deformities noted Neurological: Alert oriented x 4, grossly nonfocal Psychological:  Alert and cooperative. Normal mood and affect  Assessment and Recommendations:  1. Diarrhea related to lettuce and [place] foods.   2.  Abnormal CT of the colon likely related to infectious colitis.  Abnormalities have resolved per January 2019 CT scan.  3. CRC screening, average risk. Schedule colonoscopy. The risks (including bleeding, perforation, infection, missed lesions, medication reactions and possible hospitalization or surgery if complications occur), benefits, and alternatives to colonoscopy with possible  biopsy and possible polypectomy were discussed with the patient and they consent to proceed.   4. GERD.  Continue omeprazole 20 mg daily.  Follow standard antireflux measures.  Rule out esophagitis, Barrett's.  Schedule EGD. The risks (including bleeding, perforation, infection, missed lesions, medication reactions and possible hospitalization or surgery if complications occur), benefits, and alternatives to endoscopy with possible biopsy and possible dilation were discussed with the patient and they consent to proceed.   5. Hold Plavix 5 days and Pletal 7 days respectively before procedures - will instruct when and how to resume after procedure. Low but real risk of cardiovascular event such as heart attack, stroke, embolism, thrombosis or ischemia/infarct of other organs off Plavix and Pletal explained and need to seek urgent help if this occurs. The patient consents to proceed. Will communicate by phone or EMR with patient's prescribing provider to confirm that holding Plavix and Pletal is reasonable in this case.

## 2017-11-10 NOTE — Telephone Encounter (Signed)
   Autumn Hull 12/15/1960 [id number]  Dear Dr. Sabra Heck:  We have scheduled the above named patient for a(n) Upper Endoscopy/Colonoscopy procedure. Our records show that (s)he is on anticoagulation therapy.  Please advise as to whether the patient may come off their therapy of Plavix and Pletal 5 and 7 days prior to their procedure which is scheduled for 12/14/17.  Please route your response to Marlon Pel, CMA or fax response to [phone].  Sincerely,    [facility]

## 2017-11-22 NOTE — Telephone Encounter (Signed)
Faxed letter again to get approval to Dr. Ammie Ferrier office.

## 2017-11-25 NOTE — Telephone Encounter (Signed)
Received fax from [place] at [facility] in [place] stating patient can hold Plavix and Pletal 5 days prior to her procedures. Dr. Fuller Plan, you previously mentioned a 5 day hold for Plavix and a 7 day hold for Pletal. Is there recommendations of a 5 day hold for both ok?

## 2017-11-25 NOTE — Telephone Encounter (Signed)
Informed patient to hold Pletal and Plavix 5 days prior to her procedures. Patient verbalized understanding.

## 2017-11-25 NOTE — Telephone Encounter (Signed)
Left a message for patient to return my call. 

## 2017-11-25 NOTE — Telephone Encounter (Signed)
5 day hold for both meds is fine

## 2017-12-02 ENCOUNTER — Encounter: Payer: Self-pay | Admitting: Gastroenterology

## 2017-12-14 ENCOUNTER — Ambulatory Visit (AMBULATORY_SURGERY_CENTER): Payer: 59 | Admitting: Gastroenterology

## 2017-12-14 ENCOUNTER — Other Ambulatory Visit: Payer: Self-pay

## 2017-12-14 ENCOUNTER — Encounter: Payer: Self-pay | Admitting: Gastroenterology

## 2017-12-14 VITALS — BP 148/73 | HR 58 | Temp 97.1°F | Resp 16 | Ht 59.0 in | Wt 164.0 lb

## 2017-12-14 DIAGNOSIS — D125 Benign neoplasm of sigmoid colon: Secondary | ICD-10-CM

## 2017-12-14 DIAGNOSIS — D126 Benign neoplasm of colon, unspecified: Secondary | ICD-10-CM

## 2017-12-14 DIAGNOSIS — K635 Polyp of colon: Secondary | ICD-10-CM | POA: Diagnosis not present

## 2017-12-14 DIAGNOSIS — K219 Gastro-esophageal reflux disease without esophagitis: Secondary | ICD-10-CM

## 2017-12-14 DIAGNOSIS — D123 Benign neoplasm of transverse colon: Secondary | ICD-10-CM | POA: Diagnosis not present

## 2017-12-14 DIAGNOSIS — Z1211 Encounter for screening for malignant neoplasm of colon: Secondary | ICD-10-CM

## 2017-12-14 DIAGNOSIS — Z1212 Encounter for screening for malignant neoplasm of rectum: Secondary | ICD-10-CM

## 2017-12-14 DIAGNOSIS — K449 Diaphragmatic hernia without obstruction or gangrene: Secondary | ICD-10-CM

## 2017-12-14 DIAGNOSIS — K626 Ulcer of anus and rectum: Secondary | ICD-10-CM

## 2017-12-14 DIAGNOSIS — K6289 Other specified diseases of anus and rectum: Secondary | ICD-10-CM

## 2017-12-14 MED ORDER — SODIUM CHLORIDE 0.9 % IV SOLN: 500.0000 mL | Freq: Once | INTRAVENOUS | Status: DC

## 2017-12-14 NOTE — Progress Notes (Signed)
Report given to PACU, vss 

## 2017-12-14 NOTE — Patient Instructions (Signed)
YOU HAD AN ENDOSCOPIC PROCEDURE TODAY AT [facility]:   Refer to the procedure report that was given to you for any specific questions about what was found during the examination.  If the procedure report does not answer your questions, please call your gastroenterologist to clarify.  If you requested that your care partner not be given the details of your procedure findings, then the procedure report has been included in a sealed envelope for you to review at your convenience later.  YOU SHOULD EXPECT: Some feelings of bloating in the abdomen. Passage of more gas than usual.  Walking can help get rid of the air that was put into your GI tract during the procedure and reduce the bloating. If you had a lower endoscopy (such as a colonoscopy or flexible sigmoidoscopy) you may notice spotting of blood in your stool or on the toilet paper. If you underwent a bowel prep for your procedure, you may not have a normal bowel movement for a few days.  Please Note:  You might notice some irritation and congestion in your nose or some drainage.  This is from the oxygen used during your procedure.  There is no need for concern and it should clear up in a day or so.  SYMPTOMS TO REPORT IMMEDIATELY:   Following lower endoscopy (colonoscopy or flexible sigmoidoscopy):  Excessive amounts of blood in the stool  Significant tenderness or worsening of abdominal pains  Swelling of the abdomen that is new, acute  Fever of 100F or higher   Following upper endoscopy (EGD)  Vomiting of blood or coffee ground material  New chest pain or pain under the shoulder blades  Painful or persistently difficult swallowing  New shortness of breath  Fever of 100F or higher  Black, tarry-looking stools  For urgent or emergent issues, a gastroenterologist can be reached at any hour by calling [phone].   DIET:  We do recommend a small meal at first, but then you may proceed to your regular diet.  Drink  plenty of fluids but you should avoid alcoholic beverages for 24 hours.  ACTIVITY:  You should plan to take it easy for the rest of today and you should NOT DRIVE or use heavy machinery until tomorrow (because of the sedation medicines used during the test).    FOLLOW UP: Our staff will call the number listed on your records the next business day following your procedure to check on you and address any questions or concerns that you may have regarding the information given to you following your procedure. If we do not reach you, we will leave a message.  However, if you are feeling well and you are not experiencing any problems, there is no need to return our call.  We will assume that you have returned to your regular daily activities without incident.  If any biopsies were taken you will be contacted by phone or by letter within the next 1-3 weeks.  Please call us at [phone] if you have not heard about the biopsies in 3 weeks.    SIGNATURES/CONFIDENTIALITY: You and/or your care partner have signed paperwork which will be entered into your electronic medical record.  These signatures attest to the fact that that the information above on your After Visit Summary has been reviewed and is understood.  Full responsibility of the confidentiality of this discharge information lies with you and/or your care-partner.    Handouts were given to your care partner on a  antireflux measures, hiatal hernia, polyps, diverticulosis, and a high fiber diet with liberal fluid intake. Per Dr. Fuller Plan you may resume PLAVIX and PLETAL tomorrow at prior dose. You may resume your other current medications today. Await biopsy results. Please call if any questions or concerns.

## 2017-12-14 NOTE — Progress Notes (Signed)
Called to room to assist during endoscopic procedure.  Patient ID and intended procedure confirmed with present staff. Received instructions for my participation in the procedure from the performing physician.  

## 2017-12-14 NOTE — Progress Notes (Signed)
No problems noted in the recovery room. 

## 2017-12-14 NOTE — Op Note (Addendum)
[place] Patient Name: Autumn Hull Procedure Date: 12/14/2017 3:08 PM MRN: [id number] Endoscopist: Ladene Artist , MD Age: 57 Referring MD:  Date of Birth: 11-01-1960 Gender: Female Account #: [id number] Procedure:                Colonoscopy Indications:              Screening for colorectal malignant neoplasm Medicines:                Monitored Anesthesia Care Procedure:                Pre-Anesthesia Assessment:                           - Prior to the procedure, a History and Physical                            was performed, and patient medications and                            allergies were reviewed. The patient's tolerance of                            previous anesthesia was also reviewed. The risks                            and benefits of the procedure and the sedation                            options and risks were discussed with the patient.                            All questions were answered, and informed consent                            was obtained. Prior Anticoagulants: The patient has                            taken Plavix and Pletal (cilostazol), last doses                            were 5 days and 7 days prior to procedure                            respectively. ASA Grade Assessment: III - A patient                            with severe systemic disease. After reviewing the                            risks and benefits, the patient was deemed in                            satisfactory condition to undergo the procedure.  After obtaining informed consent, the colonoscope                            was passed under direct vision. Throughout the                            procedure, the patient's blood pressure, pulse, and                            oxygen saturations were monitored continuously. The                            Colonoscope was introduced through the anus and                            advanced to the  the cecum, identified by                            appendiceal orifice and ileocecal valve. The                            ileocecal valve, appendiceal orifice, and rectum                            were photographed. The quality of the bowel                            preparation was good. The colonoscopy was performed                            without difficulty. The patient tolerated the                            procedure well. Scope In: 3:16:36 PM Scope Out: 3:37:16 PM Scope Withdrawal Time: 0 hours 12 minutes 23 seconds  Total Procedure Duration: 0 hours 20 minutes 40 seconds  Findings:                 The perianal and digital rectal examinations were                            normal.                           A single medium-sized localized angiodysplastic                            lesion without bleeding was found in the cecum.                           A 7 mm polyp was found in the transverse colon. The                            polyp was sessile. The polyp was removed with a  cold snare. Resection and retrieval were complete.                           A 3 mm polyp was found in the sigmoid colon. The                            polyp was sessile. The polyp was removed with a                            cold biopsy forceps. Resection and retrieval were                            complete.                           A few small-mouthed diverticula were found in the                            sigmoid colon.                           A localized area of mildly erythematous mucosa was                            found in the rectum. Biopsies were taken with a                            cold forceps for histology.                           The exam was otherwise without abnormality on                            direct and retroflexion views. Complications:            No immediate complications. Estimated blood loss:                             None. Estimated Blood Loss:     Estimated blood loss: none. Impression:               - One 7 mm polyp in the transverse colon, removed                            with a cold snare. Resected and retrieved.                           - One 3 mm polyp in the sigmoid colon, removed with                            a cold biopsy forceps. Resected and retrieved.                           - Single angiodysplasia in the cecum.                           -  Mild diverticulosis in the sigmoid colon.                           - Erythematous mucosa in the rectum. Biopsied.                           - The examination was otherwise normal on direct                            and retroflexion views. Recommendation:           - Repeat colonoscopy in 5 years for surveillance if                            polyp(s) are precancerous, otherwise 10 years.                           - Patient has a contact number available for                            emergencies. The signs and symptoms of potential                            delayed complications were discussed with the                            patient. Return to normal activities tomorrow.                            Written discharge instructions were provided to the                            patient.                           - Resume previous diet.                           - Continue present medications.                           - Await pathology results.                           - Resume Plavix (clopidogrel) tomorrow and Pletal                            (cilostazol) tomorrow at prior doses. Refer to                            managing physician for further adjustment of                            therapy. Ladene Artist, MD 12/14/2017 3:49:32 PM This report has been signed electronically.

## 2017-12-14 NOTE — Op Note (Addendum)
[facility] Patient Name: Autumn Hull Procedure Date: 12/14/2017 3:07 PM MRN: [id number] Endoscopist: Ladene Artist , MD Age: 57 Referring MD:  Date of Birth: [birth year] Gender: Female Account #: [id number] Procedure:                Upper GI endoscopy Indications:              Gastroesophageal reflux disease Medicines:                Monitored Anesthesia Care Procedure:                Pre-Anesthesia Assessment:                           - Prior to the procedure, a History and Physical                            was performed, and patient medications and                            allergies were reviewed. The patient's tolerance of                            previous anesthesia was also reviewed. The risks                            and benefits of the procedure and the sedation                            options and risks were discussed with the patient.                            All questions were answered, and informed consent                            was obtained. Prior Anticoagulants: The patient has                            taken Plavix (clopidogrel) and Pletal, last doses                            were 5 days and 7 days prior to procedure                            respectively. ASA Grade Assessment: III - A patient                            with severe systemic disease. After reviewing the                            risks and benefits, the patient was deemed in                            satisfactory condition to undergo the procedure.  After obtaining informed consent, the endoscope was                            passed under direct vision. Throughout the                            procedure, the patient's blood pressure, pulse, and                            oxygen saturations were monitored continuously. The                            Endoscope was introduced through the mouth, and                            advanced to the second  part of duodenum. The upper                            GI endoscopy was accomplished without difficulty.                            The patient tolerated the procedure well. Scope In: Scope Out: Findings:                 One benign-appearing, intrinsic mild stenosis was                            found at the gastroesophageal junction. This                            stenosis measured 1.6 cm (inner diameter). The                            stenosis was traversed.                           The exam of the esophagus was otherwise normal.                           A small hiatal hernia was present.                           The exam of the stomach was otherwise normal.                           The duodenal bulb and second portion of the                            duodenum were normal. Complications:            No immediate complications. Estimated Blood Loss:     Estimated blood loss: none. Impression:               - Benign-appearing esophageal stenosis.                           -  Small hiatal hernia.                           - Normal duodenal bulb and second portion of the                            duodenum.                           - No specimens collected. Recommendation:           - Patient has a contact number available for                            emergencies. The signs and symptoms of potential                            delayed complications were discussed with the                            patient. Return to normal activities tomorrow.                            Written discharge instructions were provided to the                            patient.                           - Resume previous diet.                           - Follow antireflux measures.                           - Continue present medications.                           - Resume Plavix (clopidogrel) tomorrow and Pletal                            (cilostazol) tomorrow at prior doses. Refer to                             managing physician for further adjustment of                            therapy. Ladene Artist, MD 12/14/2017 3:53:43 PM This report has been signed electronically.

## 2017-12-15 ENCOUNTER — Telehealth: Payer: Self-pay

## 2017-12-15 NOTE — Telephone Encounter (Signed)
  Follow up Call-  Call back number 12/14/2017  Post procedure Call Back phone  # [phone]  Permission to leave phone message Yes  Some recent data might be hidden     Patient questions:  Do you have a fever, pain , or abdominal swelling? No. Pain Score  0 *  Have you tolerated food without any problems? Yes.    Have you been able to return to your normal activities? Yes.    Do you have any questions about your discharge instructions: Diet   No. Medications  No. Follow up visit  No.  Do you have questions or concerns about your Care? No.  Actions: * If pain score is 4 or above: No action needed, pain <4.  No problems noted per pt. maw

## 2017-12-22 ENCOUNTER — Telehealth: Payer: Self-pay | Admitting: Gastroenterology

## 2017-12-22 NOTE — Telephone Encounter (Signed)
Patient wanting to know when her results from endo/colon done on 4.9.19 will be back.

## 2017-12-22 NOTE — Telephone Encounter (Signed)
Patient notified that we will mail her a letter with the results in the next few days that the pathology just resulted on 12/20/17 and Dr. Fuller Plan is working on it.

## 2017-12-24 ENCOUNTER — Encounter: Payer: Self-pay | Admitting: Gastroenterology

## 2018-02-08 ENCOUNTER — Telehealth: Payer: Self-pay | Admitting: Gastroenterology

## 2018-02-08 NOTE — Telephone Encounter (Signed)
Patient reports abdominal pain and diarrhea.  She states levsin makes her too sleepy, but she is not able to make it to work some mornings due to her symptoms.  She will come in and see Autumn Hull, [place] on 02/18/18.  She is offered an appt tomorrow with Dr. Fuller Plan but she is not available due to work commitments.

## 2018-02-09 ENCOUNTER — Ambulatory Visit: Payer: 59 | Admitting: Gastroenterology

## 2018-02-18 ENCOUNTER — Ambulatory Visit: Payer: 59 | Admitting: Physician Assistant

## 2021-09-29 ENCOUNTER — Other Ambulatory Visit: Payer: Self-pay | Admitting: Orthopedic Surgery

## 2021-09-29 DIAGNOSIS — M65831 Other synovitis and tenosynovitis, right forearm: Secondary | ICD-10-CM

## 2021-09-29 DIAGNOSIS — M67431 Ganglion, right wrist: Secondary | ICD-10-CM

## 2021-10-22 ENCOUNTER — Other Ambulatory Visit: Payer: Self-pay | Admitting: Orthopedic Surgery

## 2021-10-22 ENCOUNTER — Ambulatory Visit
Admission: RE | Admit: 2021-10-22 | Discharge: 2021-10-22 | Disposition: A | Discharge status: Home / Self Care | Payer: 59 | Source: Ambulatory Visit | Attending: Orthopedic Surgery | Admitting: Orthopedic Surgery

## 2021-10-22 DIAGNOSIS — M67431 Ganglion, right wrist: Secondary | ICD-10-CM

## 2021-10-22 DIAGNOSIS — M65831 Other synovitis and tenosynovitis, right forearm: Secondary | ICD-10-CM

## 2022-11-19 ENCOUNTER — Encounter: Payer: Self-pay | Admitting: Gastroenterology

## 2022-12-03 ENCOUNTER — Encounter: Payer: Self-pay | Admitting: Gastroenterology

## 2022-12-24 ENCOUNTER — Encounter: Payer: Self-pay | Admitting: Physician Assistant

## 2023-03-17 ENCOUNTER — Ambulatory Visit: Payer: 59 | Admitting: Gastroenterology

## 2023-03-24 ENCOUNTER — Telehealth: Payer: Self-pay | Admitting: Gastroenterology

## 2023-03-24 NOTE — Telephone Encounter (Signed)
Inbound call from patient stating that she is scheduled for an office visit with Autumn Hull on 7/30 at 2:00 to discuss recall colon due to being on a blood thinner. Patient stated that she will be retiring and will lose her insurance on 8/9. Patient stated that she wanted to see if there was anyway she could be seen and have her procedure before her insurance runs out. Please advise.

## 2023-03-24 NOTE — Telephone Encounter (Signed)
The pt states that she wants to cancel her appt with Hyacinth Meeker until she gets her insurance issues fixed. She will be losing coverage in 2 weeks. Appt cancelled

## 2023-04-06 ENCOUNTER — Encounter: Payer: Self-pay | Admitting: Gastroenterology

## 2023-04-06 ENCOUNTER — Ambulatory Visit: Payer: 59 | Admitting: Physician Assistant

## 2023-06-21 ENCOUNTER — Ambulatory Visit: Payer: 59 | Admitting: Nurse Practitioner

## 2023-06-21 ENCOUNTER — Encounter: Payer: Self-pay | Admitting: Nurse Practitioner

## 2023-06-21 VITALS — BP 100/70 | HR 75 | Ht 59.0 in | Wt 154.1 lb

## 2023-06-21 DIAGNOSIS — K589 Irritable bowel syndrome without diarrhea: Secondary | ICD-10-CM | POA: Diagnosis not present

## 2023-06-21 DIAGNOSIS — Z860100 Personal history of colon polyps, unspecified: Secondary | ICD-10-CM | POA: Diagnosis not present

## 2023-06-21 MED ORDER — NA SULFATE-K SULFATE-MG SULF 17.5-3.13-1.6 GM/177ML PO SOLN: 1.0000 | Freq: Once | ORAL | 0 refills | Status: AC

## 2023-06-21 NOTE — Patient Instructions (Addendum)
You have been scheduled for a colonoscopy. Please follow written instructions given to you at your visit today.   Please pick up your prep supplies at the pharmacy within the next 1-3 days.  If you use inhalers (even only as needed), please bring them with you on the day of your procedure. ____________________________________________  [place] [employer]- 1 tablespoon daily as tolerated  Miralax- every night as needed  Due to recent changes in healthcare laws, you may see the results of your imaging and laboratory studies on MyChart before your provider has had a chance to review them.  We understand that in some cases there may be results that are confusing or concerning to you. Not all laboratory results come back in the same time frame and the provider may be waiting for multiple results in order to interpret others.  Please give [place] 48 hours in order for your provider to thoroughly review all the results before contacting the office for clarification of your results.   Thank you for trusting me with your gastrointestinal care!   Alcide Evener, CRNP

## 2023-06-21 NOTE — Progress Notes (Signed)
06/21/2023 Autumn Hull [id number] 1960-12-19   CHIEF COMPLAINT: Urgent bowel movements schedule colonoscopy  HISTORY OF PRESENT ILLNESS: Autumn Hull is a 62 year old female with a past medical history of anxiety, depression, arthritis, hypertension, hyperlipidemia, peripheral vascular disease s/p bilateral LE ablation 2015 on Plavix and Pletal, pneumonia, chronic tobacco use, alpha gal, DM type II, GERD, infectious colitis and colon polyps. She is known by Dr. Russella Dar.  She presents today for further evaluation regarding urgent "blowout" bowel movements and to schedule a colonoscopy. She typically passes a normal formed brown bowel movement at least 5 days weekly and she has a loose stool once weekly. She also describes having episodes of urgent explosive diarrhea bowel movements  with abdominal cramping which results in soiling herself before she has time to reach the bathroom which occurs once every few months for the past year. Her most recent episode occurred yesterday evening after she ate a few bites of [employer] fish sandwich. No specific food triggers. No recent antibiotic use. She saw a small amount of bright red blood on the toilet tissue few occasions over the past few months which occurred when she felt constipated and abated one month ago after she started taking a stool softener. She drinks less than 64 ounces of water daily. 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 She remains on Plavix and Pletal for peripheral vascular disease, she endorsed undergoing bilateral lower extremity ablation in 2015 with continued follow-up with vascular specialist Dr. Daryel November in [place]. No history of coronary artery disease. She has arthritis and takes Meloxicam 15 mg daily. No GERD symptoms or dysphagia on Omeprazole 20 mg daily. She drinks 1 cup of coffee every morning and a 12 pack of [facility] will last 1 week. Dairy intake is limited. Her most recent EGD was 12/14/2017 which showed a  small hiatal hernia and a benign-appearing esophageal stenosis. The colonoscopy identified 1 tubular adenomatous and 1 hyperplastic polyp removed from the colon. She was advised to repeat a colonoscopy in 5 years. Paternal uncle with history of colon cancer. She had labs done by her PCP last month which she reported were normal.  PAST GI PROCEDURES:  EGD 4/09/202019: - Benign-appearing esophageal stenosis.  - Small hiatal hernia.  - Normal duodenal bulb and second portion of the duodenum.  - No specimens collected.  Colonoscopy 12/14/2017: - One 7 mm polyp in the transverse colon, removed with a cold snare. Resected and retrieved.  - One 3 mm polyp in the sigmoid colon, removed with a cold biopsy forceps. Resected and retrieved.  - Single angiodysplasia in the cecum.  - Mild diverticulosis in the sigmoid colon.  - Erythematous mucosa in the rectum. Biopsied.  - The examination was otherwise normal on direct and retroflexion views. Path report:  Tubular adenoma Hyperplastic polyp High-grade dysplasia is not identified Rectal biopsy, inflamed colorectal type mucosa with ulcerations There is no evidence of idiopathic inflammatory bowel disease, dysplasia or malignancy   Past Medical History:  Diagnosis Date   Anxiety    Arthritis    Depression    Elevated cholesterol    Gallstones    GERD (gastroesophageal reflux disease)    Hypertension    Hypertension    Infectious colitis    Pneumonia    Skin cancer    Urinary tract infection    Past Surgical History:  Procedure Laterality Date   ABDOMINAL HYSTERECTOMY     jaw resection     Social  History: She is divorced.  She is a clinical hemodialysis technician.  She smokes cigarettes < 1/2 pack per day x 40 years  Family History: Paternal uncle (not maternal uncle as previously documented) had colon cancer. Sister had diverticulitis, required partial colon resection. Father history of CAD/MI/CABG.   Allergies  Allergen Reactions    Alpha-Gal Anaphylaxis   Sulfa Antibiotics     Rash, vomiting.      Outpatient Encounter Medications as of 06/21/2023  Medication Sig   cilostazol (PLETAL) 100 MG tablet Take 100 mg by mouth daily.   clopidogrel (PLAVIX) 75 MG tablet Take 75 mg by mouth daily.   EPINEPHrine 0.3 mg/0.3 mL IJ SOAJ injection Inject 0.3 mg into the muscle once.   meloxicam (MOBIC) 15 MG tablet Take 15 mg by mouth daily.   mirtazapine (REMERON) 7.5 MG tablet Take 7.5 mg by mouth at bedtime.   omeprazole (PRILOSEC) 20 MG capsule Take 20 mg by mouth daily.   Vitamin D, Ergocalciferol, (DRISDOL) 50000 UNITS CAPS capsule Take 50,000 Units by mouth 2 (two) times a week.   [DISCONTINUED] hyoscyamine (LEVSIN SL) 0.125 MG SL tablet Place 1 tablet (0.125 mg total) under the tongue every 4 (four) hours as needed. (Patient not taking: Reported on 12/14/2017)   [DISCONTINUED] loratadine (CLARITIN) 10 MG tablet Take 10 mg by mouth daily.   [DISCONTINUED] Na Sulfate-K Sulfate-Mg Sulf 17.5-3.13-1.6 GM/177ML SOLN Take 1 kit by mouth as directed.   [DISCONTINUED] nebivolol (BYSTOLIC) 10 MG tablet Take 10 mg by mouth daily.   [DISCONTINUED] Omega 3 1000 MG CAPS Take by mouth daily.   [DISCONTINUED] venlafaxine XR (EFFEXOR-XR) 150 MG 24 hr capsule Take 150 mg by mouth daily with breakfast.   [DISCONTINUED] zolpidem (AMBIEN) 5 MG tablet Take 5 mg by mouth at bedtime as needed.    Facility-Administered Encounter Medications as of 06/21/2023  Medication   0.9 %  sodium chloride infusion   REVIEW OF SYSTEMS:  Gen: + Fatigue. Denies fever, sweats or chills. No weight loss.  CV: Denies chest pain, palpitations or edema. Resp: Denies cough, shortness of breath of hemoptysis.  GI: Denies heartburn, dysphagia, stomach or lower abdominal pain. No diarrhea or constipation.  GU: Denies urinary burning, blood in urine, increased urinary frequency or incontinence. AV:WUJWJX pain/cramps and arthritis. Derm: Denies rash, itchiness, skin  lesions or unhealing ulcers. Psych: + Anxiety and depression. Heme: Denies bruising, easy bleeding. Neuro:  Denies headaches, dizziness or paresthesias. Endo:  Denies any problems with DM, thyroid or adrenal function.  PHYSICAL EXAM: Ht 4\' 11"  (1.499 m)   Wt 154 lb 2 oz (69.9 kg)   BMI 31.13 kg/m  General: 87 female in no acute distress. Head: Normocephalic and atraumatic. Eyes:  Sclerae non-icteric, conjunctive pink. Ears: Normal auditory acuity. Mouth: Dentition intact. No ulcers or lesions.  Neck: Supple, no lymphadenopathy or thyromegaly.  Lungs: Clear bilaterally to auscultation without wheezes, crackles or rhonchi. Heart: Regular rate and rhythm. No murmur, rub or gallop appreciated.  Abdomen: Soft, nontender, nondistended. No masses. No hepatosplenomegaly. Normoactive bowel sounds x 4 quadrants.  Rectal: Deferred.  Musculoskeletal: Symmetrical with no gross deformities. Skin: Warm and dry. No rash or lesions on visible extremities. Extremities: No edema. Neurological: Alert oriented x 4, no focal deficits.  Psychological:  Alert and cooperative. Normal mood and affect.  ASSESSMENT AND PLAN:  62 year old female with intermittent explosive urgent diarrhea resulting in fecal incontinence -Take Imodium 1/2-1 tab daily as needed, stop if no BM in 24 hours -Benefiber 1 tablespoon  daily as tolerated -Reduce [employer] intake -Colonoscopy benefits and risks discussed including risk with sedation, risk of bleeding, perforation and infection  -Request of most recent CBC and CMP from PCPs office  History of colon polyps.  Colonoscopy 12/2017 identified 1 tubular adenomatous and 1 hyperplastic polyp removed from the colon.  Paternal uncle with history of colon cancer. -Colonoscopy benefits and risks discussed including risk with sedation, risk of bleeding, perforation and infection   Constipation, intermittent -MiraLAX nightly as tolerated -Benefiber as ordered above -Stool  softener of choice every day as needed  Rectal bleeding, associated with straining/constipation -Plan above regarding constipation treatment and colonoscopy -Patient to contact office if rectal bleeding persists or worsens prior to colonoscopy date  GERD, stable. EGD 12/2017 showed a benign esophageal stenosis and a small hiatal hernia. -Continue omeprazole 20 mg daily  Peripheral vascular disease s/p R lower extremity ablation on Plavix and Pletal -Our office will contact the patient's vascular specialist Dr. Daryel November in [place] to verify if okay to hold Plavix for 7 days and Pletal x 7 days prior to proceeding with a colonoscopy as ordered above  Chronic tobacco use -smoking cessation recommended   CC:  Alinda Deem, MD

## 2023-07-01 ENCOUNTER — Telehealth: Payer: Self-pay

## 2023-07-01 NOTE — Telephone Encounter (Signed)
Contacted pt & pt verbalized understanding to hold Plavix for 5 days and Pletal for 7 days prior to her procedure per Cardiology.

## 2023-07-20 ENCOUNTER — Encounter: Payer: Self-pay | Admitting: Gastroenterology

## 2023-07-20 ENCOUNTER — Ambulatory Visit: Payer: 59 | Admitting: Gastroenterology

## 2023-07-20 VITALS — BP 101/62 | HR 70 | Temp 98.0°F | Resp 31 | Ht 59.0 in | Wt 154.0 lb

## 2023-07-20 DIAGNOSIS — Z09 Encounter for follow-up examination after completed treatment for conditions other than malignant neoplasm: Secondary | ICD-10-CM

## 2023-07-20 DIAGNOSIS — Z860101 Personal history of adenomatous and serrated colon polyps: Secondary | ICD-10-CM

## 2023-07-20 DIAGNOSIS — K589 Irritable bowel syndrome without diarrhea: Secondary | ICD-10-CM

## 2023-07-20 DIAGNOSIS — R197 Diarrhea, unspecified: Secondary | ICD-10-CM

## 2023-07-20 MED ORDER — SODIUM CHLORIDE 0.9 % IV SOLN: 500.0000 mL | Freq: Once | INTRAVENOUS | Status: DC

## 2023-07-20 NOTE — Progress Notes (Signed)
See 06/21/2023 H&P no changes

## 2023-07-20 NOTE — Progress Notes (Signed)
Sedate, gd SR, tolerated procedure well, VSS, report to RN 

## 2023-07-20 NOTE — Op Note (Signed)
[facility] Patient Name: Autumn Hull Procedure Date: 07/20/2023 11:20 AM MRN: [id number] Endoscopist: Meryl Dare , MD, [phone] Age: 62 Referring MD:  Date of Birth: 1961-08-02 Gender: Female Account #: [id number] Procedure:                Colonoscopy Indications:              Surveillance: Personal history of adenomatous                            polyps on last colonoscopy 5 years ago Medicines:                Monitored Anesthesia Care Procedure:                Pre-Anesthesia Assessment:                           - Prior to the procedure, a History and Physical                            was performed, and patient medications and                            allergies were reviewed. The patient's tolerance of                            previous anesthesia was also reviewed. The risks                            and benefits of the procedure and the sedation                            options and risks were discussed with the patient.                            All questions were answered, and informed consent                            was obtained. Prior Anticoagulants: The patient has                            taken Plavix (clopidogrel), last dose was 5 days                            prior to procedure and Pletal 7 days prior to                            procedure. ASA Grade Assessment: III - A patient                            with severe systemic disease. After reviewing the                            risks and benefits, the patient was deemed in  satisfactory condition to undergo the procedure.                           After obtaining informed consent, the colonoscope                            was passed under direct vision. Throughout the                            procedure, the patient's blood pressure, pulse, and                            oxygen saturations were monitored continuously. The                             [id number]-HQ190L Colonoscope [id number] was introduced                            through the anus and advanced to the the cecum,                            identified by appendiceal orifice and ileocecal                            valve. The ileocecal valve, appendiceal orifice,                            and rectum were photographed. The quality of the                            bowel preparation was good. The colonoscopy was                            performed without difficulty. The patient tolerated                            the procedure well. Scope In: 11:33:46 AM Scope Out: 11:49:31 AM Scope Withdrawal Time: 0 hours 10 minutes 38 seconds  Total Procedure Duration: 0 hours 15 minutes 45 seconds  Findings:                 The perianal and digital rectal examinations were                            normal.                           Three small localized angioectasias without                            bleeding were found in the ascending colon (1) and                            in the cecum (2).  Multiple small-mouthed diverticula were found in                            the left colon. There was no evidence of                            diverticular bleeding.                           The exam was otherwise without abnormality on                            direct and retroflexion views. Complications:            No immediate complications. Estimated blood loss:                            None. Estimated Blood Loss:     Estimated blood loss: none. 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                           - Mild diverticulosis in the left colon.                           - The examination was otherwise normal on direct                            and retroflexion views.                           - No specimens collected. Recommendation:           - Repeat colonoscopy in 10 years for screening                             purposes.                           - Resume Plavix (clopidogrel) and Pletal today at                            prior dose. Refer to managing physician for further                            adjustment of therapy.                           - Patient has a contact number available for                            emergencies. The signs and symptoms of potential                            delayed complications were discussed with the  patient. Return to normal activities tomorrow.                            Written discharge instructions were provided to the                            patient.                           - Resume previous diet.                           - Continue present medications. Meryl Dare, MD 07/20/2023 11:55:52 AM This report has been signed electronically.

## 2023-07-20 NOTE — Patient Instructions (Addendum)

## 2023-07-20 NOTE — Progress Notes (Signed)
Pt's states no medical or surgical changes since previsit or office visit. 

## 2023-07-21 ENCOUNTER — Telehealth: Payer: Self-pay

## 2023-07-21 NOTE — Telephone Encounter (Signed)
Left message on follow up call. 

## 2023-10-03 IMAGING — US US EXTREM UP *R* LTD
2 series · 14 of 25 positions shown · non-contrast
Comparison: None.

CLINICAL DATA: Painful right dorsal hand/wrist mass for the past 2
months.

EXAM:
ULTRASOUND RIGHT UPPER EXTREMITY LIMITED
TECHNIQUE: Ultrasound examination of the upper extremity soft tissues was
performed in the area of clinical concern.

[Series 1: us extrem up *right* ltd · 0.06mm/px · 19 acquisitions, 11 frames shown (1 of 2)]
[im 1/19]
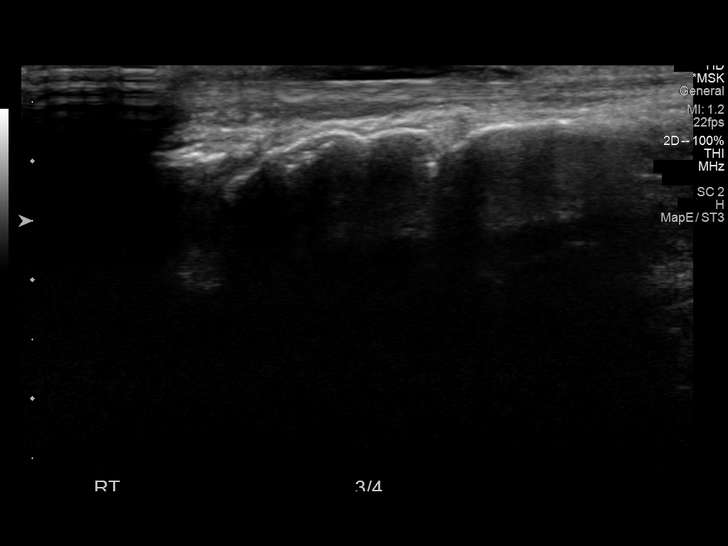
[im 3/19]
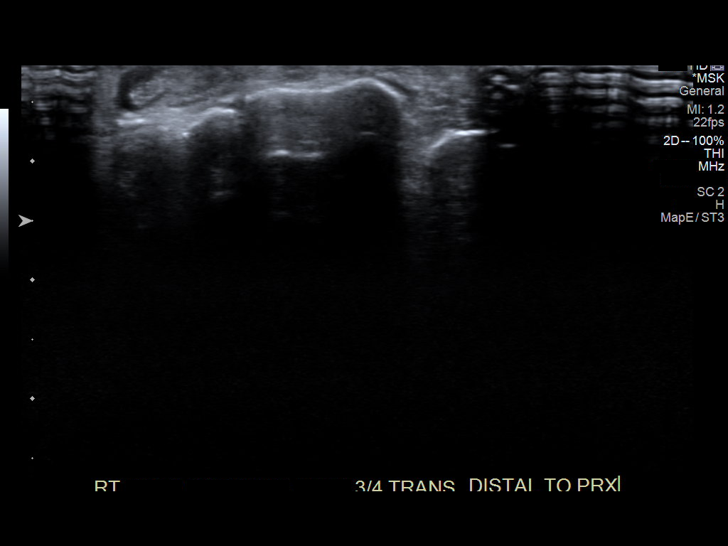
[im 5/19]
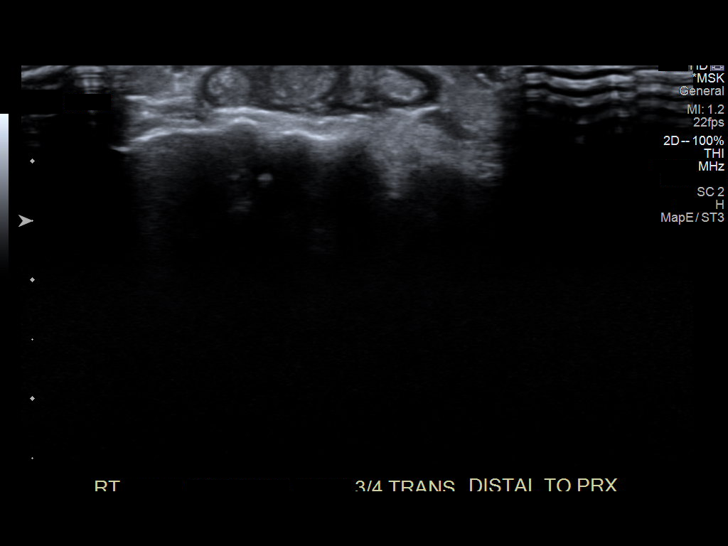
[im 7/19]
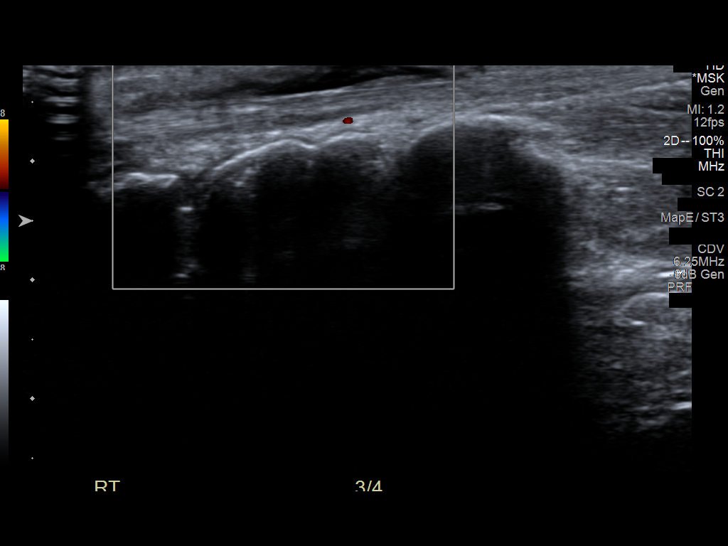
[im 9/19]
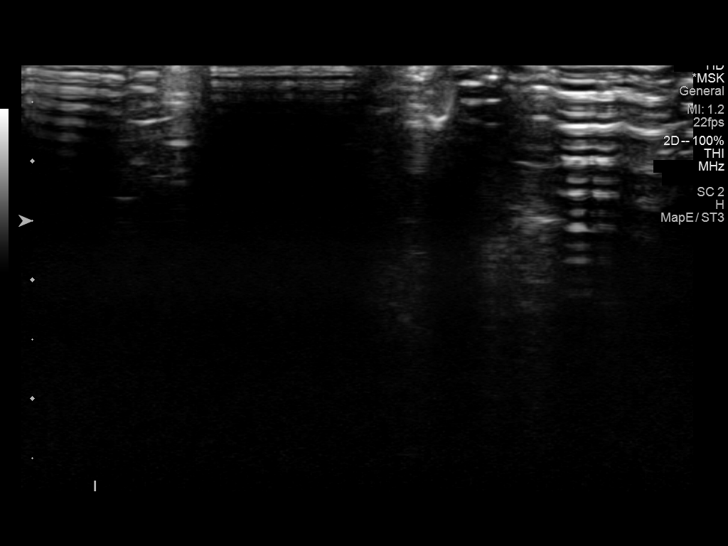
[im 10/19]
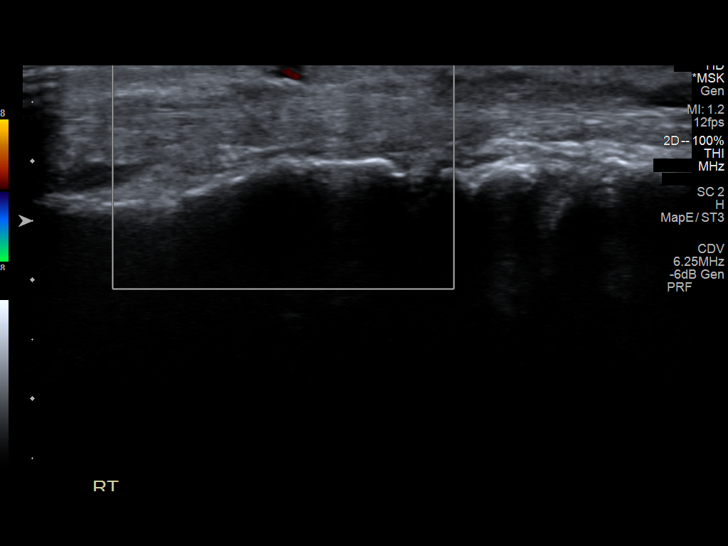
[im 12/19]
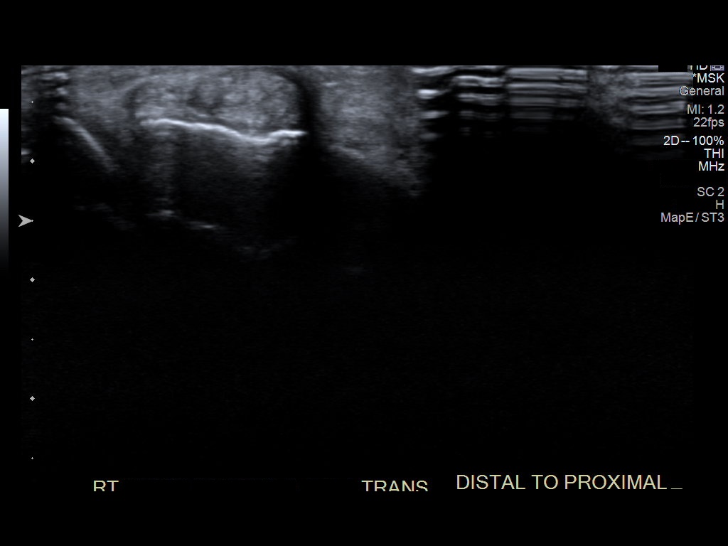
[im 14/19]
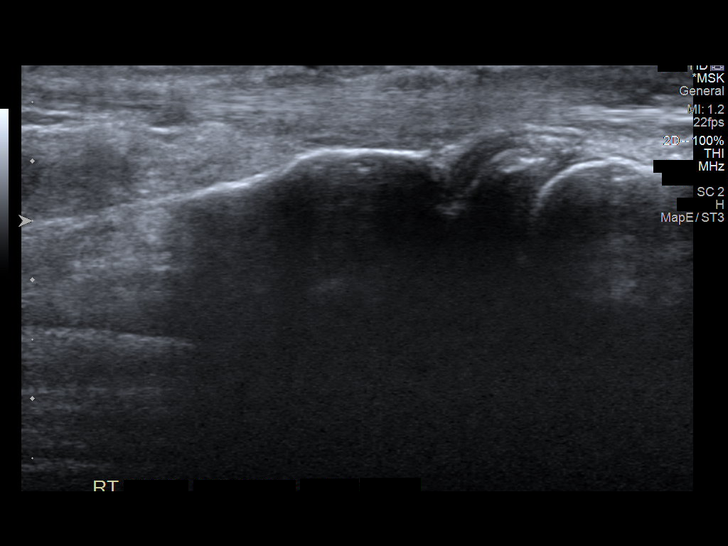
[im 16/19]
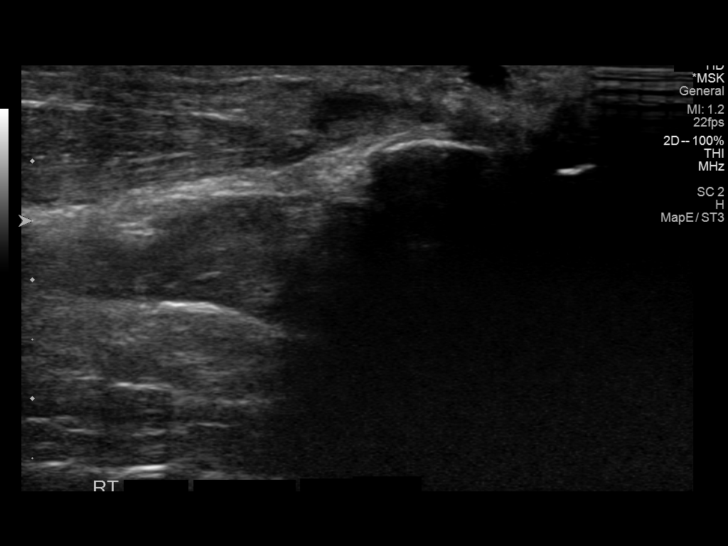
[im 17/19]
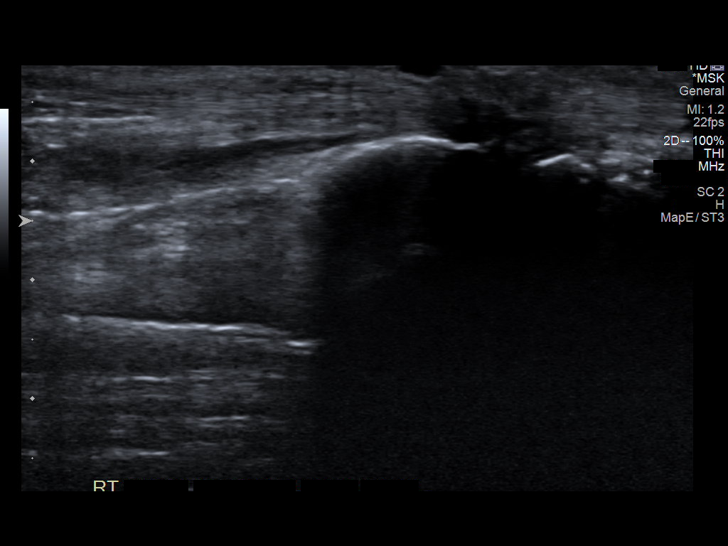
[im 19/19]
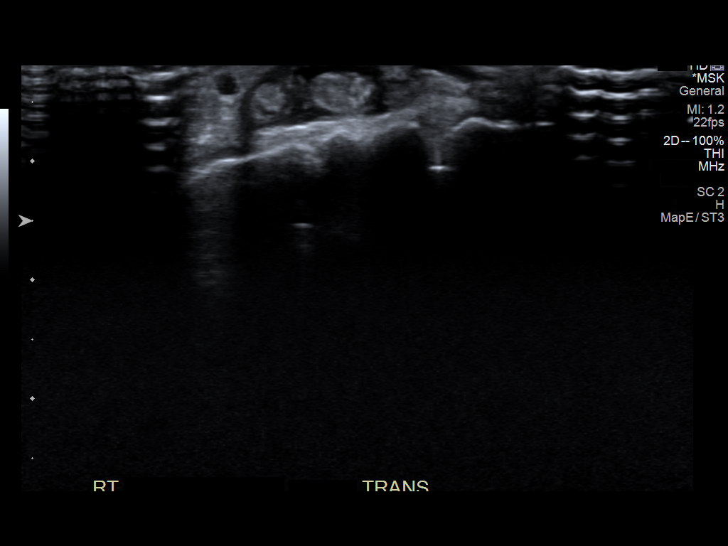

[Series 2: us extrem up *right* ltd · 0.06mm/px · 3 of 6 slices shown (2 of 2)]
[im 2/6]
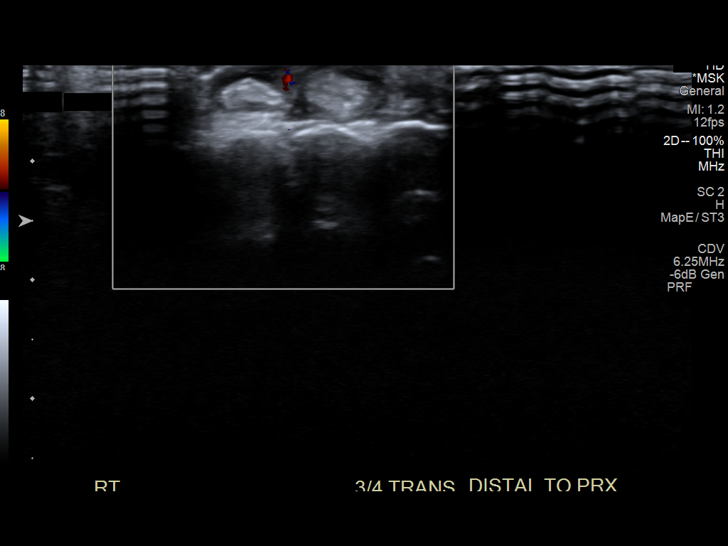
[im 4/6]
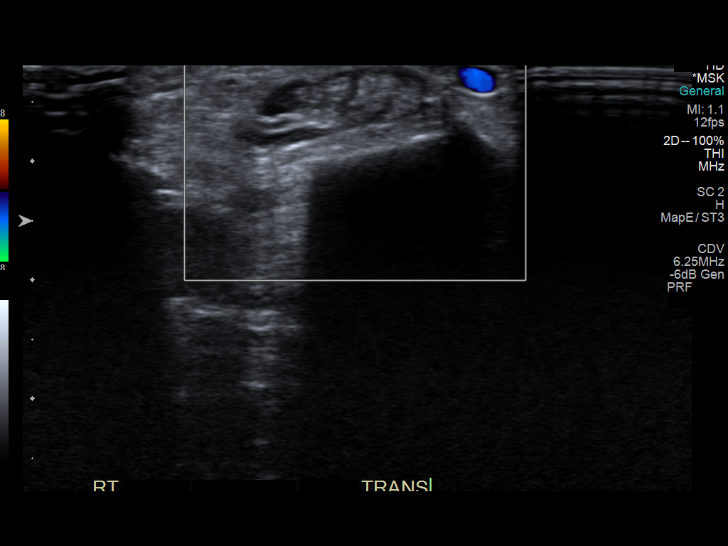
[im 6/6]
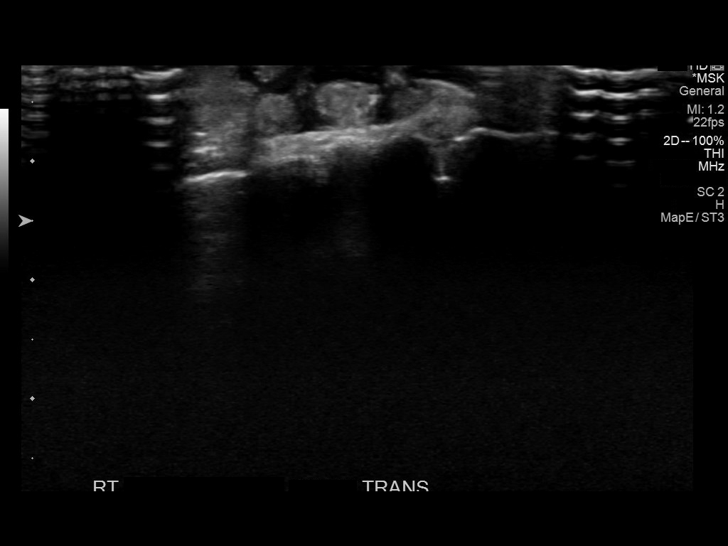

[14 of 25 positions shown; findings below may reference images not displayed]

FINDINGS: Focused ultrasound of the dorsal hand/wrist in the area of clinical
concern demonstrates a small amount of fluid within the fourth
extensor compartment tendon sheath with mild synovitis. No soft
tissue mass or fluid collection.
IMPRESSION: 1. Palpable abnormality corresponds to mild fourth extensor
compartment tenosynovitis.
